# Patient Record
Sex: Male | Born: 1995 | Race: Black or African American | Hispanic: No | Marital: Single | Smoking: Never smoker
Health system: Southern US, Community
[De-identification: ages and names within clinical notes are randomized; demographics above are authoritative.]

## PROBLEM LIST (undated history)

## (undated) ENCOUNTER — Emergency Department (HOSPITAL_COMMUNITY): Admission: EM | Payer: BLUE CROSS/BLUE SHIELD | Source: Home / Self Care

---

## 2018-03-02 ENCOUNTER — Encounter (HOSPITAL_COMMUNITY): Payer: Self-pay | Admitting: Emergency Medicine

## 2018-03-02 ENCOUNTER — Other Ambulatory Visit: Payer: Self-pay

## 2018-03-02 ENCOUNTER — Emergency Department (HOSPITAL_COMMUNITY)
Admission: EM | Admit: 2018-03-02 | Discharge: 2018-03-03 | Disposition: A | Discharge status: Home / Self Care | Payer: BLUE CROSS/BLUE SHIELD | Attending: Emergency Medicine | Admitting: Emergency Medicine

## 2018-03-02 ENCOUNTER — Emergency Department (HOSPITAL_COMMUNITY): Payer: BLUE CROSS/BLUE SHIELD

## 2018-03-02 DIAGNOSIS — R002 Palpitations: Secondary | ICD-10-CM | POA: Insufficient documentation

## 2018-03-02 LAB — CBC
HCT: 45.7 % (ref 39.0–52.0)
Hemoglobin: 14.4 g/dL (ref 13.0–17.0)
MCH: 24.4 pg — ABNORMAL LOW (ref 26.0–34.0)
MCHC: 31.5 g/dL (ref 30.0–36.0)
MCV: 77.3 fL — AB (ref 80.0–100.0)
NRBC: 0 % (ref 0.0–0.2)
Platelets: 237 10*3/uL (ref 150–400)
RBC: 5.91 MIL/uL — ABNORMAL HIGH (ref 4.22–5.81)
RDW: 14.3 % (ref 11.5–15.5)
WBC: 5.8 10*3/uL (ref 4.0–10.5)

## 2018-03-02 LAB — BASIC METABOLIC PANEL
Anion gap: 8 (ref 5–15)
BUN: 8 mg/dL (ref 6–20)
CO2: 29 mmol/L (ref 22–32)
CREATININE: 1.03 mg/dL (ref 0.61–1.24)
Calcium: 9.6 mg/dL (ref 8.9–10.3)
Chloride: 103 mmol/L (ref 98–111)
GFR calc Af Amer: >60 mL/min (ref >60)
GFR calc non Af Amer: >60 mL/min (ref >60)
Glucose, Bld: 120 mg/dL — ABNORMAL HIGH (ref 70–99)
Potassium: 3.5 mmol/L (ref 3.5–5.1)
Sodium: 140 mmol/L (ref 135–145)

## 2018-03-02 LAB — I-STAT TROPONIN, ED: Troponin i, poc: 0 ng/mL (ref 0.00–0.08)

## 2018-03-02 NOTE — ED Triage Notes (Signed)
Onset today at 0100 developed chest tightness and palpitations. Currently 2/10 tightness denies shortness of breath.

## 2018-03-03 NOTE — ED Provider Notes (Signed)
[facility]  CSN: [id number] Arrival date & time: 03/02/18 1657  Chief Complaint(s) Palpitations and Chest Pain  HPI Jesus Pearson is a 23 y.o. male   The history is provided by the patient.  Palpitations   This is a recurrent problem. The current episode started 12 to 24 hours ago. Episode frequency: intermittent. The problem has been resolved (asymptomatic for over 8 hrs). Associated with: marijuana use. Episode Length: a few hours. Associated symptoms include chest pain ("mild discomfort"). Pertinent negatives include no diaphoresis, no fever, no chest pressure, no exertional chest pressure, no irregular heartbeat, no cough and no shortness of breath. He has tried nothing for the symptoms.    Past Medical History History reviewed. No pertinent past medical history. There are no active problems to display for this patient.  Home Medication(s) Prior to Admission medications   Not on File                                                                                                                                    Past Surgical History History reviewed. No pertinent surgical history. Family History No family history on file.  Social History Social History   Tobacco Use  . Smoking status: Never Smoker  Substance Use Topics  . Alcohol use: Never    Frequency: Never  . Drug use: Yes    Types: Marijuana   Allergies Amoxicillin  Review of Systems Review of Systems  Constitutional: Negative for diaphoresis and fever.  Respiratory: Negative for cough and shortness of breath.   Cardiovascular: Positive for chest pain ("mild discomfort") and palpitations.   All other systems are reviewed and are negative for acute change except as noted in the HPI  Physical Exam Vital Signs  I have reviewed the triage vital signs BP 129/69 (BP Location: Left Arm)   Pulse 68   Temp 98.5 F (36.9 C) (Oral)   Resp 16   Ht 5\' 9"   (1.753 m)   Wt 86.2 kg   SpO2 98%   BMI 28.06 kg/m   Physical Exam Vitals signs reviewed.  Constitutional:      General: He is not in acute distress.    Appearance: He is well-developed. He is not diaphoretic.  HENT:     Head: Normocephalic and atraumatic.     Nose: Nose normal.  Eyes:     General: No scleral icterus.       Right eye: No discharge.        Left eye: No discharge.     Conjunctiva/sclera: Conjunctivae normal.     Pupils: Pupils are equal, round, and reactive to light.  Neck:     Musculoskeletal: Normal range of motion and neck supple.  Cardiovascular:     Rate and Rhythm: Normal rate and regular rhythm.     Heart sounds: No murmur. No friction rub. No gallop.   Pulmonary:  Effort: Pulmonary effort is normal. No respiratory distress.     Breath sounds: Normal breath sounds. No stridor. No rales.  Abdominal:     General: There is no distension.     Palpations: Abdomen is soft.     Tenderness: There is no abdominal tenderness.  Musculoskeletal:        General: No tenderness.  Skin:    General: Skin is warm and dry.     Findings: No erythema or rash.  Neurological:     Mental Status: He is alert and oriented to person, place, and time.     ED Results and Treatments Labs (all labs ordered are listed, but only abnormal results are displayed) Labs Reviewed  BASIC METABOLIC PANEL - Abnormal; Notable for the following components:      Result Value   Glucose, Bld 120 (*)    All other components within normal limits  CBC - Abnormal; Notable for the following components:   RBC 5.91 (*)    MCV 77.3 (*)    MCH 24.4 (*)    All other components within normal limits  I-STAT TROPONIN, ED                                                                                                                         EKG  EKG Interpretation  Date/Time:  Thursday March 02 2018 17:58:21 EST Ventricular Rate:  88 PR Interval:  142 QRS Duration: 90 QT  Interval:  324 QTC Calculation: 392 R Axis:   83 Text Interpretation:  Normal sinus rhythm Nonspecific ST and T wave abnormality Abnormal ECG No old tracing to compare Confirmed by Dione BoozeGlick, David ([id number]) on 03/02/2018 11:04:48 PM      Radiology Dg Chest 2 View  Result Date: 03/02/2018 CLINICAL DATA:  Chest pain. EXAM: CHEST - 2 VIEW COMPARISON:  None. FINDINGS: The heart size and mediastinal contours are within normal limits. Both lungs are clear. The visualized skeletal structures are unremarkable. IMPRESSION: Normal chest. Electronically Signed   By: Obie DredgeWilliam T Derry M.D.   On: 03/02/2018 19:10   Pertinent labs & imaging results that were available during my care of the patient were reviewed by me and considered in my medical decision making (see chart for details).  Medications Ordered in ED Medications - No data to display  Procedures Procedures  (including critical care time)  Medical Decision Making / ED Course I have reviewed the nursing notes for this encounter and the patient's prior records (if available in EHR or on provided paperwork).     Patient presents with intermittent palpitations in the setting of substance use.  He has been asymptomatic for more than 8 hours.  Triage work-up negative for any electrolyte derangements or anemia.  EKG without acute ischemic changes, dysrhythmia, blocks.   Doubt primary cardiac etiology.  The patient appears reasonably screened and/or stabilized for discharge and I doubt any other medical condition or other [facility] requiring further screening, evaluation, or treatment in the ED at this time prior to discharge.  The patient is safe for discharge with strict return precautions.   Final Clinical Impression(s) / ED Diagnoses Final diagnoses:  Palpitations   Disposition: Discharge  Condition: Good  I have  discussed the results, Dx and Tx plan with the patient who expressed understanding and agree(s) with the plan. Discharge instructions discussed at great length. The patient was given strict return precautions who verbalized understanding of the instructions. No further questions at time of discharge.    ED Discharge Orders    None       Follow Up: Cristie Hem, MD [address] [place] [address] [phone]  Schedule an appointment as soon as possible for a visit  As needed      This chart was dictated using voice recognition software.  Despite best efforts to proofread,  errors can occur which can change the documentation meaning.   Nira Conn, MD 03/03/18 [phone]

## 2018-03-03 NOTE — ED Notes (Signed)
ED Provider at bedside. 

## 2018-07-11 ENCOUNTER — Emergency Department (HOSPITAL_COMMUNITY)
Admission: EM | Admit: 2018-07-11 | Discharge: 2018-07-11 | Disposition: A | Discharge status: Home / Self Care | Payer: BLUE CROSS/BLUE SHIELD | Attending: Emergency Medicine | Admitting: Emergency Medicine

## 2018-07-11 ENCOUNTER — Encounter (HOSPITAL_COMMUNITY): Payer: Self-pay

## 2018-07-11 ENCOUNTER — Other Ambulatory Visit: Payer: Self-pay

## 2018-07-11 DIAGNOSIS — R42 Dizziness and giddiness: Secondary | ICD-10-CM | POA: Insufficient documentation

## 2018-07-11 DIAGNOSIS — F419 Anxiety disorder, unspecified: Secondary | ICD-10-CM | POA: Insufficient documentation

## 2018-07-11 DIAGNOSIS — F1290 Cannabis use, unspecified, uncomplicated: Secondary | ICD-10-CM | POA: Diagnosis not present

## 2018-07-11 DIAGNOSIS — R002 Palpitations: Secondary | ICD-10-CM | POA: Diagnosis not present

## 2018-07-11 LAB — COMPREHENSIVE METABOLIC PANEL
ALT: 38 U/L (ref 0–44)
AST: 23 U/L (ref 15–41)
Albumin: 4 g/dL (ref 3.5–5.0)
Alkaline Phosphatase: 41 U/L (ref 38–126)
Anion gap: 7 (ref 5–15)
BUN: 7 mg/dL (ref 6–20)
CO2: 28 mmol/L (ref 22–32)
Calcium: 9.4 mg/dL (ref 8.9–10.3)
Chloride: 106 mmol/L (ref 98–111)
Creatinine, Ser: 0.98 mg/dL (ref 0.61–1.24)
GFR calc Af Amer: >60 mL/min (ref >60)
GFR calc non Af Amer: >60 mL/min (ref >60)
Glucose, Bld: 101 mg/dL — ABNORMAL HIGH (ref 70–99)
Potassium: 3.9 mmol/L (ref 3.5–5.1)
Sodium: 141 mmol/L (ref 135–145)
Total Bilirubin: 0.6 mg/dL (ref 0.3–1.2)
Total Protein: 6.8 g/dL (ref 6.5–8.1)

## 2018-07-11 LAB — CBC WITH DIFFERENTIAL/PLATELET
Abs Immature Granulocytes: 0 10*3/uL (ref 0.00–0.07)
Basophils Absolute: 0 10*3/uL (ref 0.0–0.1)
Basophils Relative: 1 %
Eosinophils Absolute: 0.1 10*3/uL (ref 0.0–0.5)
Eosinophils Relative: 2 %
HCT: 43.6 % (ref 39.0–52.0)
Hemoglobin: 13.7 g/dL (ref 13.0–17.0)
Immature Granulocytes: 0 %
Lymphocytes Relative: 42 %
Lymphs Abs: 1.9 10*3/uL (ref 0.7–4.0)
MCH: 24.3 pg — ABNORMAL LOW (ref 26.0–34.0)
MCHC: 31.4 g/dL (ref 30.0–36.0)
MCV: 77.4 fL — ABNORMAL LOW (ref 80.0–100.0)
Monocytes Absolute: 0.3 10*3/uL (ref 0.1–1.0)
Monocytes Relative: 7 %
Neutro Abs: 2.2 10*3/uL (ref 1.7–7.7)
Neutrophils Relative %: 48 %
Platelets: 218 10*3/uL (ref 150–400)
RBC: 5.63 MIL/uL (ref 4.22–5.81)
RDW: 13.9 % (ref 11.5–15.5)
WBC: 4.6 10*3/uL (ref 4.0–10.5)
nRBC: 0 % (ref 0.0–0.2)

## 2018-07-11 LAB — MAGNESIUM: Magnesium: 1.9 mg/dL (ref 1.7–2.4)

## 2018-07-11 LAB — TROPONIN I: Troponin I: <0.03 ng/mL (ref <0.03)

## 2018-07-11 MED ORDER — PROPRANOLOL HCL 10 MG PO TABS: 10.0000 mg | ORAL_TABLET | Freq: Three times a day (TID) | ORAL | 0 refills | Status: AC | PRN

## 2018-07-11 MED ORDER — SODIUM CHLORIDE 0.9 % IV BOLUS
1000.0000 mL | Freq: Once | INTRAVENOUS | Status: AC
  Administered 2018-07-11: 21:00:00 1000 mL via INTRAVENOUS

## 2018-07-11 NOTE — ED Triage Notes (Signed)
Pt endorses episode of tachycardia last night and felt anxious. HR shot to 130. Had similar episode 30 minutes ago. Denies CP, shob or drug use. Hr 105 in triage. Pt appears slightly anxious in triage.

## 2018-07-11 NOTE — ED Notes (Addendum)
Pt ambulated to the bathroom w/ no problems.  This is his third trip to the bathroom, states he drank "a lot of water before coming."  Also just finished bolus of NS.  Denies any urinary symptoms.

## 2018-07-11 NOTE — ED Provider Notes (Signed)
[facility] EMERGENCY DEPARTMENT Provider Note   CSN: [id number] Arrival date & time: 07/11/18  1841    History   Chief Complaint Chief Complaint  Patient presents with  . Anxiety  . Palpitations    HPI Jesus Pearson is a 23 y.o. male.     The history is provided by the patient and medical records. No language interpreter was used.  Palpitations  Palpitations quality:  Fast Onset quality:  Sudden Duration:  20 seconds Timing:  Sporadic Progression:  Waxing and waning Chronicity:  Recurrent Context: dehydration   Context: not anxiety, not appetite suppressants, not blood loss, not caffeine, not exercise, not hyperventilation, not illicit drugs and not stimulant use   Relieved by:  Nothing Worsened by:  Nothing Associated symptoms: no back pain, no chest pain, no chest pressure, no cough, no diaphoresis, no dizziness, no leg pain, no lower extremity edema, no malaise/fatigue, no nausea, no near-syncope, no numbness, no shortness of breath, no vomiting and no weakness   Risk factors: no heart disease and no hx of atrial fibrillation     History reviewed. No pertinent past medical history.  There are no active problems to display for this patient.   History reviewed. No pertinent surgical history.      Home Medications    Prior to Admission medications   Not on File    Family History History reviewed. No pertinent family history.  Social History Social History   Tobacco Use  . Smoking status: Never Smoker  Substance Use Topics  . Alcohol use: Never    Frequency: Never  . Drug use: Yes    Types: Marijuana     Allergies   Amoxicillin   Review of Systems Review of Systems  Constitutional: Negative for chills, diaphoresis, fatigue, fever and malaise/fatigue.  Eyes: Negative for visual disturbance.  Respiratory: Negative for cough, chest tightness, shortness of breath and wheezing.   Cardiovascular: Positive for palpitations.  Negative for chest pain and near-syncope.  Gastrointestinal: Negative for abdominal pain, constipation, diarrhea, nausea and vomiting.  Genitourinary: Negative for flank pain and frequency.  Musculoskeletal: Negative for back pain.  Neurological: Positive for light-headedness. Negative for dizziness, weakness and numbness.  Psychiatric/Behavioral: Negative for agitation and confusion.  All other systems reviewed and are negative.    Physical Exam Updated Vital Signs BP (!) 141/84 (BP Location: Right Arm)   Pulse (!) 105   Temp 98 F (36.7 C) (Oral)   Resp 16   SpO2 100%   Physical Exam Vitals signs and nursing note reviewed.  Constitutional:      General: He is not in acute distress.    Appearance: He is well-developed. He is not ill-appearing, toxic-appearing or diaphoretic.  HENT:     Head: Normocephalic and atraumatic.     Nose: No congestion or rhinorrhea.     Mouth/Throat:     Mouth: Mucous membranes are moist.     Pharynx: No oropharyngeal exudate or posterior oropharyngeal erythema.  Eyes:     Conjunctiva/sclera: Conjunctivae normal.  Neck:     Musculoskeletal: Neck supple.  Cardiovascular:     Rate and Rhythm: Normal rate and regular rhythm.     Pulses: Normal pulses.     Heart sounds: Normal heart sounds. No murmur.  Pulmonary:     Effort: Pulmonary effort is normal. No respiratory distress.     Breath sounds: Normal breath sounds. No wheezing, rhonchi or rales.  Chest:     Chest wall: No tenderness.  Abdominal:     Palpations: Abdomen is soft.     Tenderness: There is no abdominal tenderness. There is no right CVA tenderness or left CVA tenderness.  Musculoskeletal:        General: No tenderness.     Right lower leg: No edema.     Left lower leg: No edema.  Skin:    General: Skin is warm and dry.     Capillary Refill: Capillary refill takes less than 2 seconds.     Findings: No rash.  Neurological:     General: No focal deficit present.     Mental  Status: He is alert.  Psychiatric:        Mood and Affect: Mood normal.      ED Treatments / Results  Labs (all labs ordered are listed, but only abnormal results are displayed) Labs Reviewed  CBC WITH DIFFERENTIAL/PLATELET - Abnormal; Notable for the following components:      Result Value   MCV 77.4 (*)    MCH 24.3 (*)    All other components within normal limits  COMPREHENSIVE METABOLIC PANEL - Abnormal; Notable for the following components:   Glucose, Bld 101 (*)    All other components within normal limits  MAGNESIUM  TROPONIN I    EKG EKG Interpretation  Date/Time:  Tuesday Jul 11 2018 18:46:52 EDT Ventricular Rate:  110 PR Interval:  134 QRS Duration: 86 QT Interval:  312 QTC Calculation: 422 R Axis:   82 Text Interpretation:  Sinus tachycardia with Premature supraventricular complexes Possible Left atrial enlargement Borderline ECG When compared to prior, faster rate  NO STEMI Confirmed by Theda Belfast ([id number]) on 07/11/2018 7:46:45 PM   Radiology No results found.  Procedures Procedures (including critical care time)  Medications Ordered in ED Medications  sodium chloride 0.9 % bolus 1,000 mL (0 mLs Intravenous Stopped 07/11/18 2302)     Initial Impression / Assessment and Plan / ED Course  I have reviewed the triage vital signs and the nursing notes.  Pertinent labs & imaging results that were available during my care of the patient were reviewed by me and considered in my medical decision making (see chart for details).        Jesus Pearson is a 23 y.o. male with no significant past medical history who presents with palpitations.  Patient reports that several months ago he had an episode of palpitations lasting approximately 15 seconds prompting him to come to the emergency department.  At that time he had a reassuring work-up and was discharged feeling it was likely to marijuana use.  He reports he did not smoked any marijuana since January but  yesterday had an episode similar with tachycardia rapid palpitations lasting approximately 20 seconds while in the car.  He reports that he got lightheaded slightly and felt hot during the episode.  He denies any chest pain or shortness of breath with it.  He denies any syncope.  He denies any recent stimulant use, caffeine use, drug use, or medication use.  He denies any new workout supplement use.  He denies feeling dehydrated or change in his sleep.  He reports that today he had another episode prompting him to come in for evaluation.  He reports the episodes were brief but had severe palpitations with tachycardia.  Patient denies any cardiac family history and denies any early death in the family.  He has no history of WPW or other conductive cardiac disorders.  On exam, lungs are clear  and chest is nontender.  Abdomen is nontender.  Normal pulses in extremities.  Initial EKG showed a sinus rhythm.  No tachycardia was seen on my exam.  It sounds like patient had either SVT or another arrhythmia.  Initial EKG showed a slight appearance of a delta wave however cardiology was called who did not feel this was WPW.  They feel that outpatient cardiology follow-up of is reasonable and then prescription of propranolol to use as needed if he has episodes with reasonable.  They agreed with discharge.  Patient had screening laboratory testing showing no significant electrolyte abnormality.  Normal magnesium and normal potassium.  Patient felt better after fluids.  Suspect mild dehydration may precipitate symptoms.  Patient understood return precautions and had no other questions or concerns.  Patient discharged in good condition after a prolonged period of telemetry monitoring.   Final Clinical Impressions(s) / ED Diagnoses   Final diagnoses:  Palpitations    ED Discharge Orders         Ordered    propranolol (INDERAL) 10 MG tablet  3 times daily PRN     07/11/18 2259          Clinical  Impression: 1. Palpitations     Disposition: Discharge  Condition: Good  I have discussed the results, Dx and Tx plan with the pt(& family if present). He/she/they expressed understanding and agree(s) with the plan. Discharge instructions discussed at great length. Strict return precautions discussed and pt &/or family have verbalized understanding of the instructions. No further questions at time of discharge.    Discharge Medication List as of 07/11/2018 11:03 PM    START taking these medications   Details  propranolol (INDERAL) 10 MG tablet Take 1 tablet (10 mg total) by mouth 3 (three) times daily as needed (for persistent palpitations.)., Starting Tue 07/11/2018, Print        Follow Up: Cristie Hemancel, Rex, MD [address] [place] [id number] [phone]     [facility] [address] [place] [phone]       Jaci Desanto, Canary Brimhristopher J, MD 07/12/18 [phone]

## 2018-07-11 NOTE — Discharge Instructions (Signed)
Your EKG today did not show evidence of acute arrhythmia and I spoke with cardiology who agreed.  They requested I give you the prescription for the propranolol to use if you start having palpitations.  They recommended you follow-up with cardiology for outpatient work-up.  If any symptoms change or worsen, please return to the nearest emergency department.  Please stay hydrated and rest.

## 2018-07-31 ENCOUNTER — Other Ambulatory Visit: Payer: Self-pay

## 2019-11-26 IMAGING — DX DG CHEST 2V
2 series · 2 of 2 positions shown · non-contrast
Comparison: None.

CLINICAL DATA: Chest pain.

EXAM:
CHEST - 2 VIEW

[chest pa]
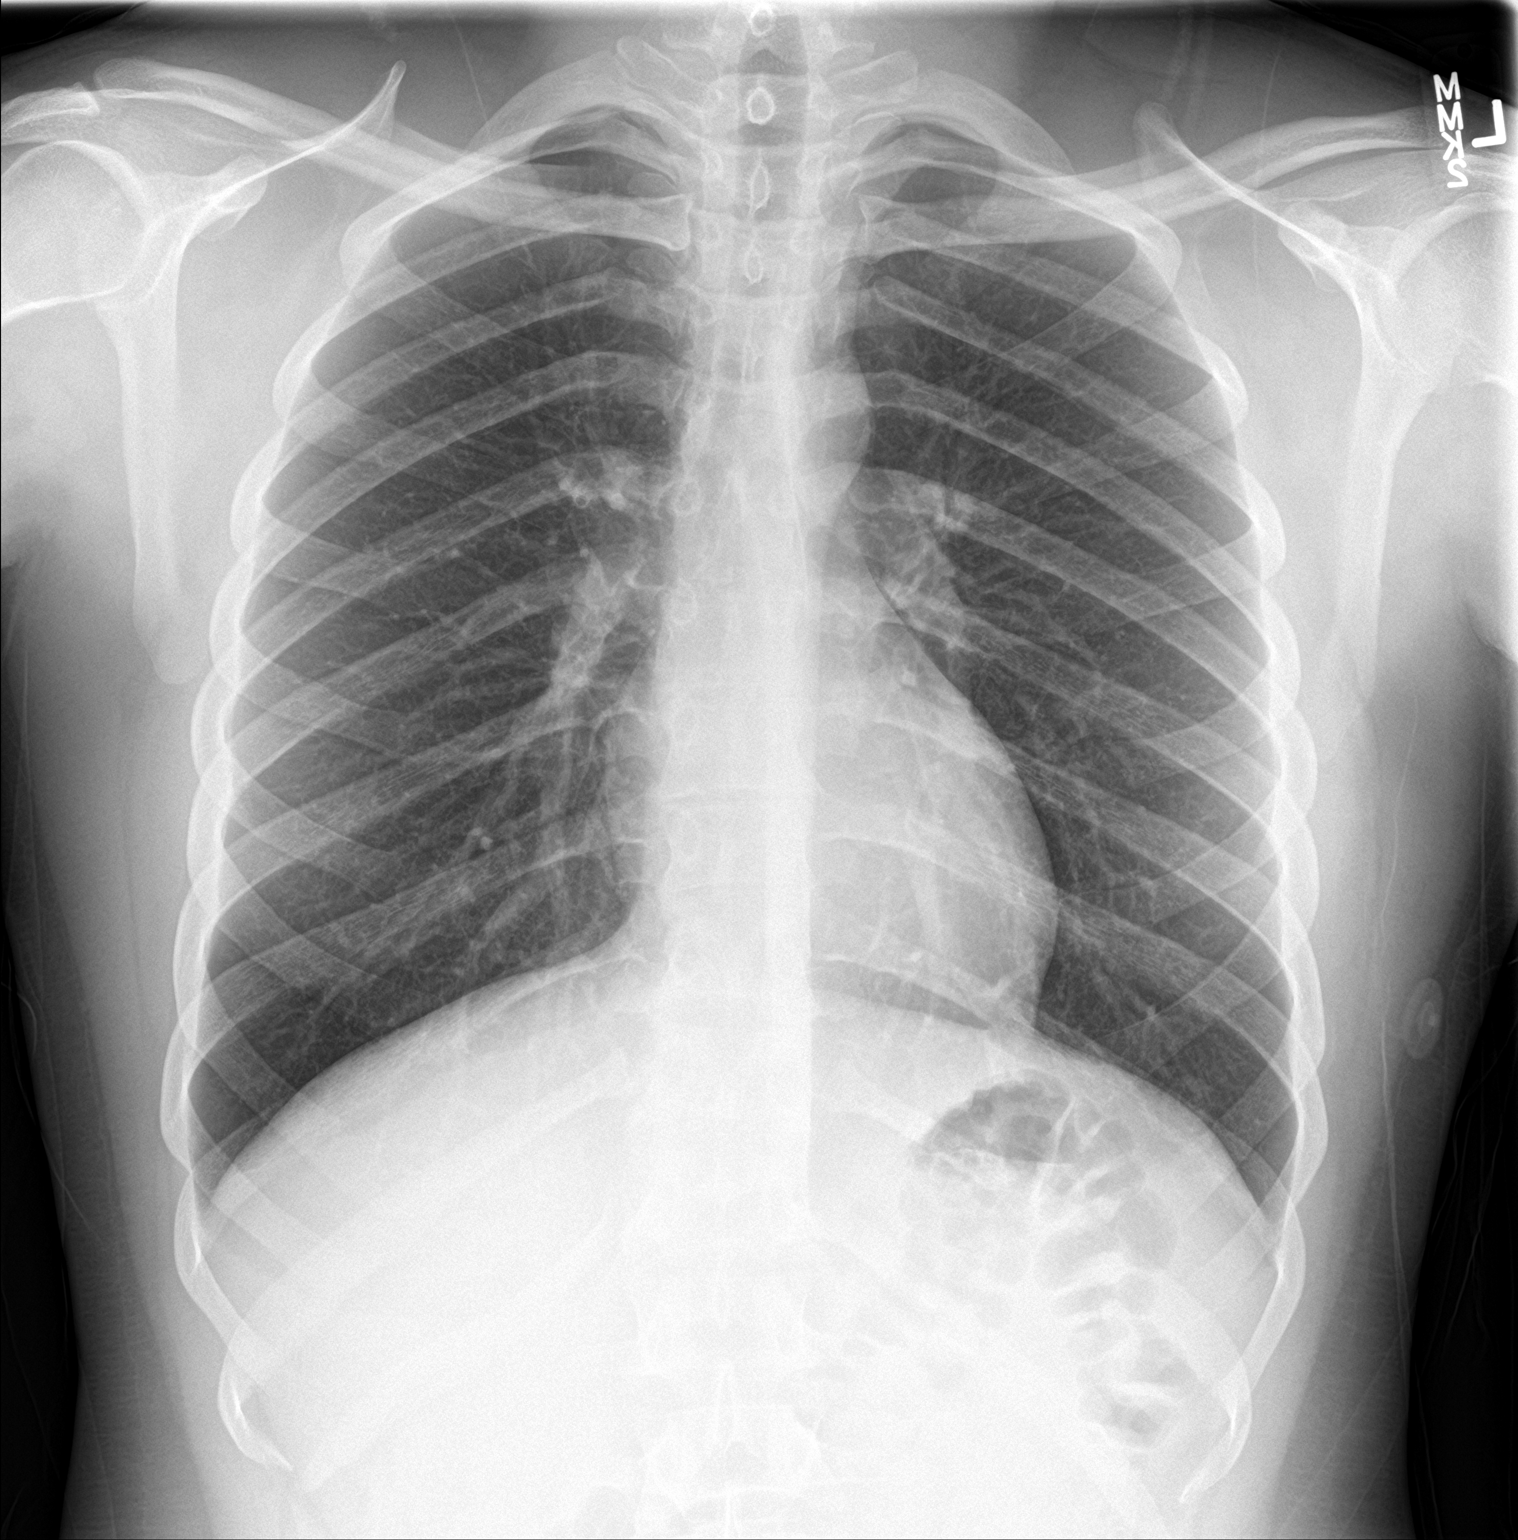

[chest lat]
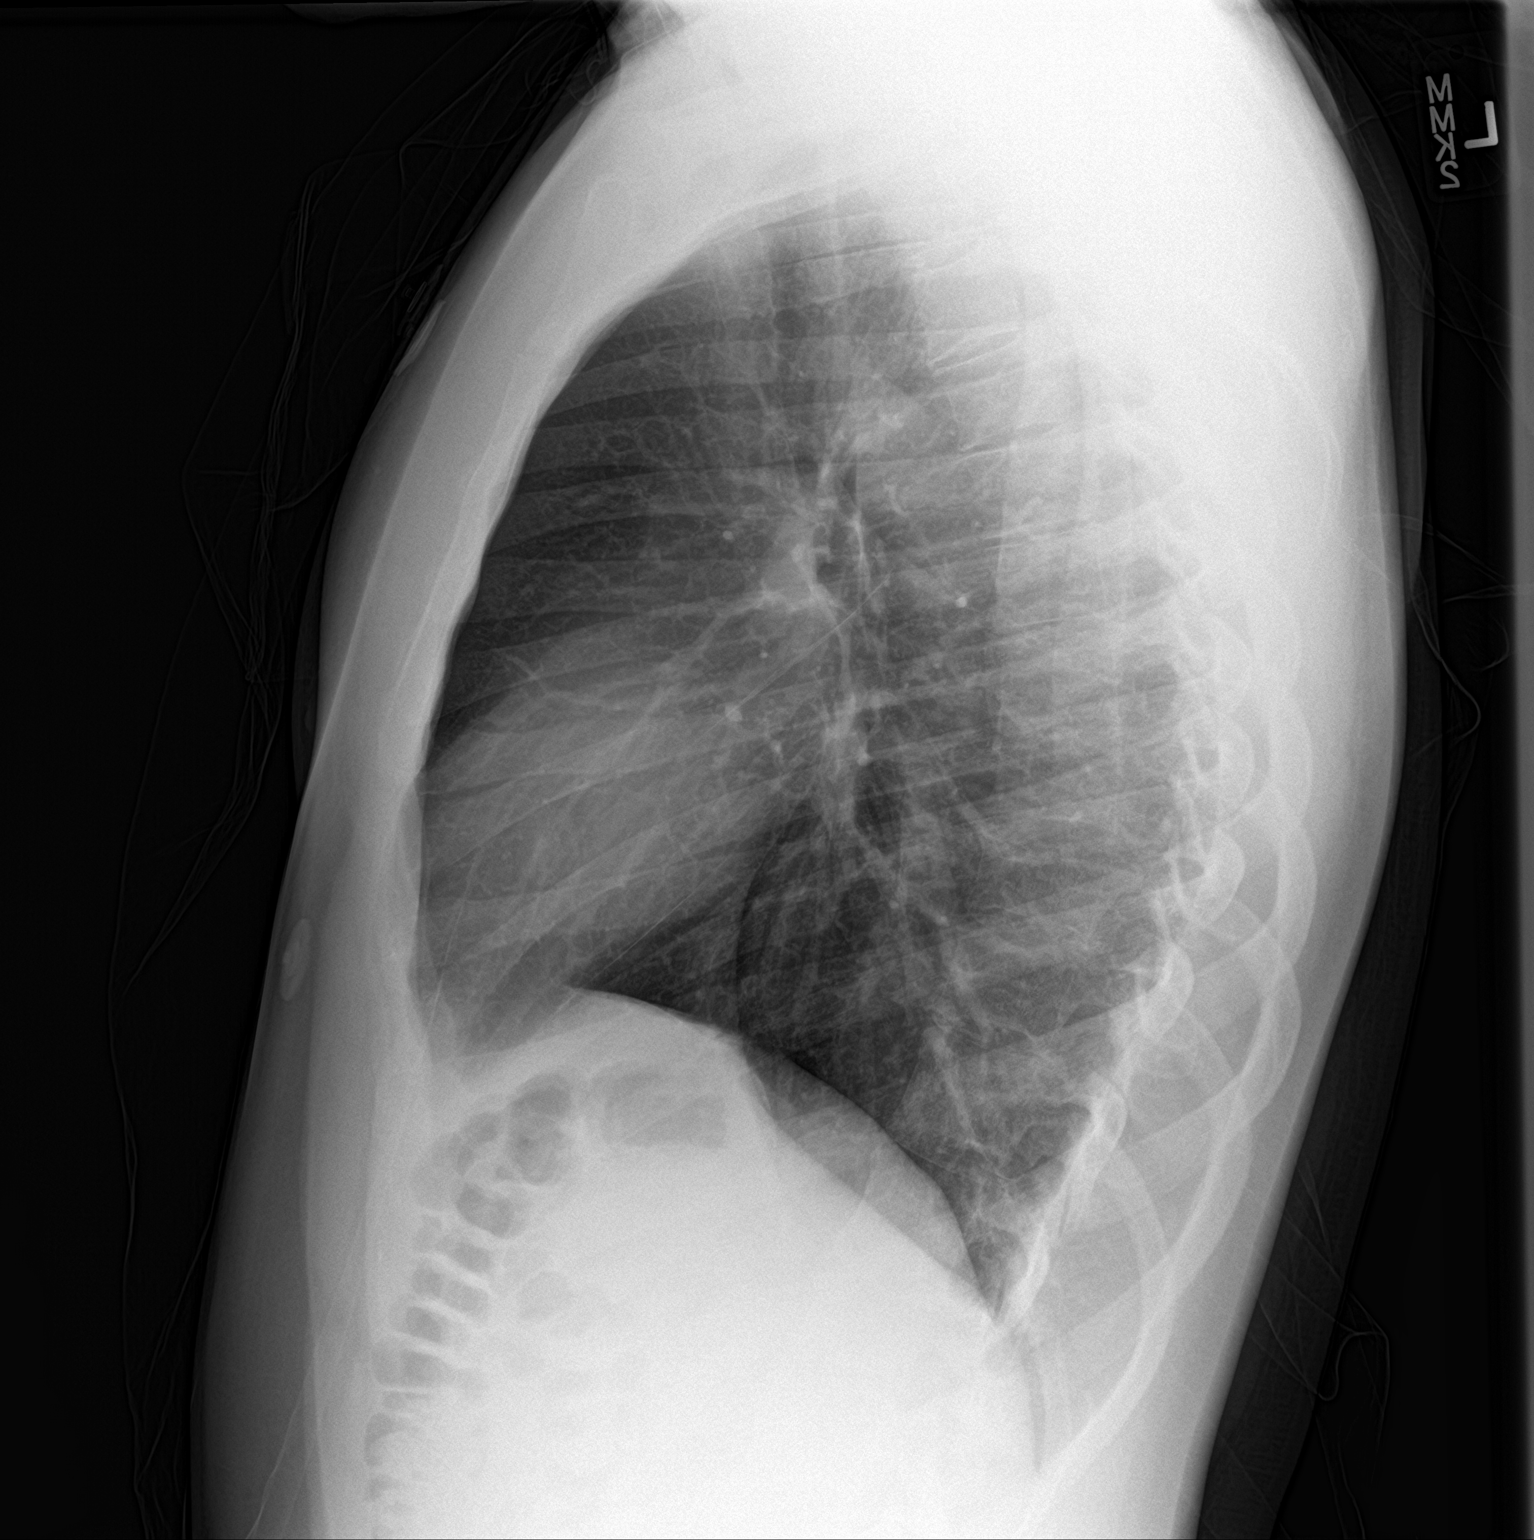

[2 of 2 positions shown; findings below may reference images not displayed]

FINDINGS: The heart size and mediastinal contours are within normal limits.
Both lungs are clear. The visualized skeletal structures are
unremarkable.
IMPRESSION: Normal chest.
